# Patient Record
Sex: Male | Born: 1990 | Race: Black or African American | Hispanic: No | Marital: Single
Health system: Southern US, Community
[De-identification: ages and names within clinical notes are randomized; demographics above are authoritative.]

## PROBLEM LIST (undated history)

## (undated) DIAGNOSIS — F909 Attention-deficit hyperactivity disorder, unspecified type: Secondary | ICD-10-CM

---

## 2008-09-07 ENCOUNTER — Emergency Department (HOSPITAL_COMMUNITY): Admission: EM | Admit: 2008-09-07 | Discharge: 2008-09-07 | Payer: Self-pay | Admitting: Emergency Medicine

## 2008-10-08 ENCOUNTER — Encounter: Admission: RE | Admit: 2008-10-08 | Discharge: 2008-10-08 | Payer: Self-pay | Admitting: Family Medicine

## 2009-01-14 ENCOUNTER — Emergency Department (HOSPITAL_COMMUNITY): Admission: EM | Admit: 2009-01-14 | Discharge: 2009-01-14 | Payer: Self-pay | Admitting: Emergency Medicine

## 2014-08-19 ENCOUNTER — Ambulatory Visit (HOSPITAL_BASED_OUTPATIENT_CLINIC_OR_DEPARTMENT_OTHER): Payer: Medicaid Other | Attending: Internal Medicine | Admitting: Radiology

## 2014-08-19 VITALS — Ht 67.0 in | Wt 230.0 lb

## 2014-08-19 DIAGNOSIS — G4730 Sleep apnea, unspecified: Secondary | ICD-10-CM | POA: Diagnosis not present

## 2014-08-23 DIAGNOSIS — G4730 Sleep apnea, unspecified: Secondary | ICD-10-CM

## 2014-08-23 NOTE — Sleep Study (Signed)
   NAME: Carlos RipaBrandon Burston DATE OF BIRTH:  1990/08/25 MEDICAL RECORD NUMBER [id number]  LOCATION: [facility]  PHYSICIAN: YOUNG,CLINTON D  DATE OF STUDY: 08/19/2014  SLEEP STUDY TYPE: Nocturnal Polysomnogram               REFERRING PHYSICIAN: Dorrene GermanAvbuere, Edwin A, MD  INDICATION FOR STUDY: Hypersomnia with sleep apnea     EPWORTH SLEEPINESS SCORE:   10/24 HEIGHT: 5\' 7"  (170.2 cm)  WEIGHT: 104.327 kg (230 lb)    Body mass index is 36.01 kg/(m^2).  NECK SIZE: 17 in.  MEDICATIONS: Charted for review  SLEEP ARCHITECTURE: Total sleep time 311 minutes with sleep efficiency 87.9%. Stage I was 13.7%, stage II 77.2%, stage III absent, REM 9.2% of total sleep time. Sleep latency 16 minutes, REM latency 71 minutes, awake after sleep onset 25.5 minutes, arousal index 2, bedtime medication: None  RESPIRATORY DATA: Apnea hypopnea index (AHI) 2.9 per hour. 15 total events scored including 11 obstructive apneas, 2 central apneas, 2 hypopneas. Non-positional events. REM AHI 14.7 per hour. CPAP titration was not done.  OXYGEN DATA: Moderate to very loud snoring with oxygen desaturation to a nadir of 93% and mean saturation 96.3% on room air  CARDIAC DATA: Sinus rhythm with occasional PVC  MOVEMENT/PARASOMNIA: No significant movement disturbance, no bathroom trips  IMPRESSION/ RECOMMENDATION:   1) Sleep architecture was significant for occasional brief spontaneous waking and reduced percentage time spent in rem. If appropriate, a trial of management for insomnia might be helpful. 2) Occasional respiratory event with sleep disturbance, within normal limits. AHI 2.9 per hour. The normal range for adults is an AHI from 0-5 events per hour). Moderate to loud snoring with oxygen desaturation to a nadir of 93% and mean saturation 96.3% on room air.   Waymon BudgeYOUNG,CLINTON D Diplomate, American Board of Sleep Medicine  ELECTRONICALLY SIGNED ON:  08/23/2014, 4:13 PM [facility] PH: [phone]   FX: [phone] ACCREDITED BY THE AMERICAN ACADEMY OF SLEEP MEDICINE

## 2017-06-13 ENCOUNTER — Emergency Department (HOSPITAL_COMMUNITY): Payer: Medicaid Other

## 2017-06-13 ENCOUNTER — Encounter (HOSPITAL_COMMUNITY): Payer: Self-pay | Admitting: Emergency Medicine

## 2017-06-13 ENCOUNTER — Emergency Department (HOSPITAL_COMMUNITY)
Admission: EM | Admit: 2017-06-13 | Discharge: 2017-06-13 | Disposition: A | Discharge status: Home / Self Care | Payer: Medicaid Other | Attending: Emergency Medicine | Admitting: Emergency Medicine

## 2017-06-13 DIAGNOSIS — Y939 Activity, unspecified: Secondary | ICD-10-CM | POA: Insufficient documentation

## 2017-06-13 DIAGNOSIS — S01511A Laceration without foreign body of lip, initial encounter: Secondary | ICD-10-CM

## 2017-06-13 DIAGNOSIS — Y929 Unspecified place or not applicable: Secondary | ICD-10-CM | POA: Diagnosis not present

## 2017-06-13 DIAGNOSIS — Y999 Unspecified external cause status: Secondary | ICD-10-CM | POA: Insufficient documentation

## 2017-06-13 DIAGNOSIS — S0083XA Contusion of other part of head, initial encounter: Secondary | ICD-10-CM

## 2017-06-13 HISTORY — DX: Attention-deficit hyperactivity disorder, unspecified type: F90.9

## 2017-06-13 MED ORDER — LIDOCAINE HCL (PF) 1 % IJ SOLN
5.0000 mL | Freq: Once | INTRAMUSCULAR | Status: AC
  Administered 2017-06-13: 5 mL
  Filled 2017-06-13: qty 5
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
## 2017-06-13 NOTE — Discharge Instructions (Signed)
Call your dentist tomorrow for follow up. Eat only soft foods. Follow up with your primary care doctor in 5 days for suture removal. Return here sooner for any problems.
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
## 2017-06-13 NOTE — ED Notes (Signed)
Pt verbalizes understanding of d/c paperwork, follow up instructions. Pt A/O x4, ambulatory. All belongings with patient upon departure.  

## 2017-06-13 NOTE — ED Notes (Signed)
CSI at chair side

## 2017-06-21 ENCOUNTER — Emergency Department (HOSPITAL_COMMUNITY)
Admission: EM | Admit: 2017-06-21 | Discharge: 2017-06-21 | Discharge status: Absent without leave | Payer: Medicaid Other

## 2020-12-11 ENCOUNTER — Emergency Department (HOSPITAL_COMMUNITY)
Admission: EM | Admit: 2020-12-11 | Discharge: 2020-12-11 | Disposition: A | Discharge status: Home / Self Care | Payer: No Typology Code available for payment source | Attending: Emergency Medicine | Admitting: Emergency Medicine

## 2020-12-11 ENCOUNTER — Emergency Department (HOSPITAL_COMMUNITY): Payer: No Typology Code available for payment source

## 2020-12-11 DIAGNOSIS — Z23 Encounter for immunization: Secondary | ICD-10-CM | POA: Diagnosis not present

## 2020-12-11 DIAGNOSIS — S81812A Laceration without foreign body, left lower leg, initial encounter: Secondary | ICD-10-CM | POA: Diagnosis not present

## 2020-12-11 DIAGNOSIS — Y9355 Activity, bike riding: Secondary | ICD-10-CM | POA: Diagnosis not present

## 2020-12-11 DIAGNOSIS — T1490XA Injury, unspecified, initial encounter: Secondary | ICD-10-CM

## 2020-12-11 DIAGNOSIS — Y92410 Unspecified street and highway as the place of occurrence of the external cause: Secondary | ICD-10-CM | POA: Diagnosis not present

## 2020-12-11 DIAGNOSIS — S8992XA Unspecified injury of left lower leg, initial encounter: Secondary | ICD-10-CM | POA: Diagnosis present

## 2020-12-11 DIAGNOSIS — T07XXXA Unspecified multiple injuries, initial encounter: Secondary | ICD-10-CM

## 2020-12-11 LAB — BASIC METABOLIC PANEL
Anion gap: 9 (ref 5–15)
BUN: 11 mg/dL (ref 6–20)
CO2: 25 mmol/L (ref 22–32)
Calcium: 9 mg/dL (ref 8.9–10.3)
Chloride: 106 mmol/L (ref 98–111)
Creatinine, Ser: 1.07 mg/dL (ref 0.61–1.24)
GFR, Estimated: >60 mL/min (ref >60)
Glucose, Bld: 111 mg/dL — ABNORMAL HIGH (ref 70–99)
Potassium: 3.6 mmol/L (ref 3.5–5.1)
Sodium: 140 mmol/L (ref 135–145)

## 2020-12-11 LAB — CBC WITH DIFFERENTIAL/PLATELET
Abs Immature Granulocytes: 0.03 10*3/uL (ref 0.00–0.07)
Basophils Absolute: 0 10*3/uL (ref 0.0–0.1)
Basophils Relative: 0 %
Eosinophils Absolute: 0.1 10*3/uL (ref 0.0–0.5)
Eosinophils Relative: 1 %
HCT: 38.6 % — ABNORMAL LOW (ref 39.0–52.0)
Hemoglobin: 13.3 g/dL (ref 13.0–17.0)
Immature Granulocytes: 0 %
Lymphocytes Relative: 34 %
Lymphs Abs: 3.7 10*3/uL (ref 0.7–4.0)
MCH: 27.6 pg (ref 26.0–34.0)
MCHC: 34.5 g/dL (ref 30.0–36.0)
MCV: 80.1 fL (ref 80.0–100.0)
Monocytes Absolute: 0.6 10*3/uL (ref 0.1–1.0)
Monocytes Relative: 5 %
Neutro Abs: 6.4 10*3/uL (ref 1.7–7.7)
Neutrophils Relative %: 60 %
Platelets: 285 10*3/uL (ref 150–400)
RBC: 4.82 MIL/uL (ref 4.22–5.81)
RDW: 14.5 % (ref 11.5–15.5)
WBC: 10.8 10*3/uL — ABNORMAL HIGH (ref 4.0–10.5)
nRBC: 0 % (ref 0.0–0.2)

## 2020-12-11 LAB — I-STAT CHEM 8, ED
BUN: 11 mg/dL (ref 6–20)
Calcium, Ion: 1.09 mmol/L — ABNORMAL LOW (ref 1.15–1.40)
Chloride: 106 mmol/L (ref 98–111)
Creatinine, Ser: 1 mg/dL (ref 0.61–1.24)
Glucose, Bld: 110 mg/dL — ABNORMAL HIGH (ref 70–99)
HCT: 41 % (ref 39.0–52.0)
Hemoglobin: 13.9 g/dL (ref 13.0–17.0)
Potassium: 3.6 mmol/L (ref 3.5–5.1)
Sodium: 142 mmol/L (ref 135–145)
TCO2: 25 mmol/L (ref 22–32)

## 2020-12-11 MED ORDER — TETANUS-DIPHTH-ACELL PERTUSSIS 5-2.5-18.5 LF-MCG/0.5 IM SUSY
0.5000 mL | PREFILLED_SYRINGE | Freq: Once | INTRAMUSCULAR | Status: AC
  Administered 2020-12-11: 0.5 mL via INTRAMUSCULAR
  Filled 2020-12-11: qty 0.5

## 2020-12-11 MED ORDER — CEFAZOLIN SODIUM-DEXTROSE 1-4 GM/50ML-% IV SOLN
1.0000 g | Freq: Once | INTRAVENOUS | Status: AC
  Administered 2020-12-11: 1 g via INTRAVENOUS
  Filled 2020-12-11: qty 50

## 2020-12-11 MED ORDER — LIDOCAINE-EPINEPHRINE (PF) 2 %-1:200000 IJ SOLN
20.0000 mL | Freq: Once | INTRAMUSCULAR | Status: AC
  Administered 2020-12-11: 20 mL via INTRADERMAL
  Filled 2020-12-11: qty 20

## 2020-12-11 MED ORDER — CEPHALEXIN 250 MG PO CAPS: 250.0000 mg | ORAL_CAPSULE | Freq: Three times a day (TID) | ORAL | 0 refills | Status: AC

## 2020-12-11 MED ORDER — SODIUM CHLORIDE 0.9 % IV BOLUS
1000.0000 mL | Freq: Once | INTRAVENOUS | Status: AC
  Administered 2020-12-11: 1000 mL via INTRAVENOUS

## 2020-12-11 MED ORDER — FENTANYL CITRATE (PF) 100 MCG/2ML IJ SOLN
50.0000 ug | Freq: Once | INTRAMUSCULAR | Status: AC
Start: 2020-12-11 — End: 2020-12-11
  Administered 2020-12-11: 50 ug via INTRAVENOUS
  Filled 2020-12-11: qty 2

## 2020-12-11 NOTE — ED Provider Notes (Signed)
LACERATION REPAIR Performed by: Arnoldo Hooker Authorized by: Arnoldo Hooker Consent: Verbal consent obtained. Risks and benefits: risks, benefits and alternatives were discussed Consent given by: patient Patient identity confirmed: provided demographic data Prepped and Draped in normal sterile fashion Wound explored  Laceration Location: posterior left calf - degloving injury  Laceration Length: 33 cm stellate wound edge  No Foreign Bodies seen or palpated  Anesthesia: local infiltration  Local anesthetic: lidocaine 2% w/epinephrine  Anesthetic total: 12 ml  Irrigation method: bulb syringe Betadine and normal saline Amount of cleaning: extensive  Skin flap tacking to underlying intact fascia x 5 with 4-0 Vicryl  Skin closure: 3-0 proline  Number of sutures: 18  Technique: simple interrutped Loosely approximated closure  Patient tolerance: Patient tolerated the procedure well with no immediate complications.  COMPLEX CLOSURE   Elpidio Anis, PA-C 12/11/20 0250    Nira Conn, MD 12/11/20 [phone]
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
## 2020-12-11 NOTE — Discharge Instructions (Addendum)
Do not allow your laceration to get wet for the next 48 to 72 hours with regular water.  After that you may allow soapy water to run down it.  Please do not scrub the area.  Please use wet-to-dry dressings using sterile saline 2-3 times per day for the next 10 days.  He will need to return for suture removal in 14 days. I have prescribed prophylactic antibiotics to prevent infection for the next 5 days, please take as prescribed. If you notice any redness, increased pain, or purulent (pus) discharge from the wound please return to the emergency department to have it evaluated.  For pain control you may take at 1000 mg of Tylenol every 8 hours scheduled.   Please refrain from any strenuous activity such as running or riding bike for at least 3 to 4 weeks to allow wound to heal appropriately.

## 2020-12-29 ENCOUNTER — Ambulatory Visit (HOSPITAL_COMMUNITY)
Admission: EM | Admit: 2020-12-29 | Discharge: 2020-12-29 | Disposition: A | Discharge status: Home / Self Care | Payer: Medicare Other

## 2020-12-29 ENCOUNTER — Other Ambulatory Visit: Payer: Self-pay

## 2020-12-29 DIAGNOSIS — Z4802 Encounter for removal of sutures: Secondary | ICD-10-CM

## 2020-12-29 NOTE — ED Triage Notes (Signed)
Pt presents to have 18 sutures removed from lower left calf.

## 2021-02-11 ENCOUNTER — Other Ambulatory Visit: Payer: Self-pay | Admitting: Internal Medicine

## 2021-02-12 LAB — CBC
HCT: 42.4 % (ref 38.5–50.0)
Hemoglobin: 13.9 g/dL (ref 13.2–17.1)
MCH: 27.2 pg (ref 27.0–33.0)
MCHC: 32.8 g/dL (ref 32.0–36.0)
MCV: 83 fL (ref 80.0–100.0)
MPV: 10.4 fL (ref 7.5–12.5)
Platelets: 292 10*3/uL (ref 140–400)
RBC: 5.11 10*6/uL (ref 4.20–5.80)
RDW: 16.3 % — ABNORMAL HIGH (ref 11.0–15.0)
WBC: 7.9 10*3/uL (ref 3.8–10.8)

## 2021-02-12 LAB — COMPLETE METABOLIC PANEL WITH GFR
AG Ratio: 1.6 (calc) (ref 1.0–2.5)
ALT: 25 U/L (ref 9–46)
AST: 24 U/L (ref 10–40)
Albumin: 4.6 g/dL (ref 3.6–5.1)
Alkaline phosphatase (APISO): 46 U/L (ref 36–130)
BUN: 10 mg/dL (ref 7–25)
CO2: 22 mmol/L (ref 20–32)
Calcium: 9.2 mg/dL (ref 8.6–10.3)
Chloride: 102 mmol/L (ref 98–110)
Creat: 0.7 mg/dL (ref 0.60–1.26)
Globulin: 2.8 g/dL (calc) (ref 1.9–3.7)
Glucose, Bld: 89 mg/dL (ref 65–99)
Potassium: 4.3 mmol/L (ref 3.5–5.3)
Sodium: 138 mmol/L (ref 135–146)
Total Bilirubin: 0.5 mg/dL (ref 0.2–1.2)
Total Protein: 7.4 g/dL (ref 6.1–8.1)
eGFR: 126 mL/min/{1.73_m2} (ref >=60)

## 2021-02-12 LAB — LIPID PANEL
Cholesterol: 230 mg/dL — ABNORMAL HIGH (ref <200)
HDL: 48 mg/dL (ref >=40)
LDL Cholesterol (Calc): 163 mg/dL (calc) — ABNORMAL HIGH
Non-HDL Cholesterol (Calc): 182 mg/dL (calc) — ABNORMAL HIGH (ref <130)
Total CHOL/HDL Ratio: 4.8 (calc) (ref <5.0)
Triglycerides: 84 mg/dL (ref <150)

## 2021-02-12 LAB — VITAMIN D 25 HYDROXY (VIT D DEFICIENCY, FRACTURES): Vit D, 25-Hydroxy: 31 ng/mL (ref 30–100)

## 2021-02-12 LAB — TSH: TSH: 2.11 mIU/L (ref 0.40–4.50)
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
